# Patient Record
Sex: Male | Born: 1955 | Race: Black or African American | Hispanic: No | Marital: Single
Health system: Southern US, Community
[De-identification: ages and names within clinical notes are randomized; demographics above are authoritative.]

---

## 2013-12-23 ENCOUNTER — Observation Stay: Payer: Self-pay | Admitting: Internal Medicine

## 2013-12-23 DIAGNOSIS — R0789 Other chest pain: Secondary | ICD-10-CM

## 2013-12-23 DIAGNOSIS — R079 Chest pain, unspecified: Secondary | ICD-10-CM

## 2013-12-23 DIAGNOSIS — I517 Cardiomegaly: Secondary | ICD-10-CM

## 2013-12-23 LAB — BASIC METABOLIC PANEL
ANION GAP: 4 — AB (ref 7–16)
BUN: 17 mg/dL (ref 7–18)
CALCIUM: 8.2 mg/dL — AB (ref 8.5–10.1)
CHLORIDE: 107 mmol/L (ref 98–107)
Co2: 27 mmol/L (ref 21–32)
Creatinine: 1.3 mg/dL (ref 0.60–1.30)
GFR CALC NON AF AMER: 60 — AB
GLUCOSE: 123 mg/dL — AB (ref 65–99)
OSMOLALITY: 279 (ref 275–301)
Potassium: 4.2 mmol/L (ref 3.5–5.1)
Sodium: 138 mmol/L (ref 136–145)

## 2013-12-23 LAB — CBC
HCT: 41.6 % (ref 40.0–52.0)
HGB: 13.2 g/dL (ref 13.0–18.0)
MCH: 28.1 pg (ref 26.0–34.0)
MCHC: 31.8 g/dL — AB (ref 32.0–36.0)
MCV: 89 fL (ref 80–100)
Platelet: 266 10*3/uL (ref 150–440)
RBC: 4.7 10*6/uL (ref 4.40–5.90)
RDW: 14.8 % — ABNORMAL HIGH (ref 11.5–14.5)
WBC: 7.3 10*3/uL (ref 3.8–10.6)

## 2013-12-23 LAB — TROPONIN I
TROPONIN-I: 0.05 ng/mL
TROPONIN-I: 0.06 ng/mL — AB
TROPONIN-I: 0.05 ng/mL

## 2013-12-23 LAB — CK TOTAL AND CKMB (NOT AT ARMC)
CK, TOTAL: 547 U/L — AB
CK-MB: 1.8 ng/mL (ref 0.5–3.6)

## 2013-12-23 LAB — PROTIME-INR
INR: 1.1
Prothrombin Time: 14.1 secs (ref 11.5–14.7)

## 2013-12-23 LAB — APTT: Activated PTT: 28.2 secs (ref 23.6–35.9)

## 2014-10-01 NOTE — Consult Note (Signed)
General Aspect 59 y/o M with h/o rheumatic fever, impaired gluocse tolerance, hyperlipidemia, obesity, and BPH presents to [facility] ED this morning after waking up, rolling over to his side and developing substernal chest pain. Upon arrival to the ED initial troponin was 0.05, had GXT with 0.5 mm upslopping ST depression in inferior leads and was without chest pain during the study. Second troponin 0.06. He has remained chest pain free since his initial episode which lasted approx 10-15 minutes. _________________________   Present Illness 60 y/o M with the above history who is traveling through [place], [place] on his way to [place], as he is relocating there from [place] to be with his family. This morning he developed substernal chest pain with associated nausea and diaphoresis after waking up in his hotel room and rolling over to his side. The chest pain lasted for about 5 minutes and the associated symptoms lasted about 10-15 minutes before self resolving. Upon arrival to the ED he was symptom free and states he almost turned around. His initial troponin was 0.05 and he had a GXT which showed 0.5 mm upslopping ST depression in inferior leads. He was without chest pain during the study. He did have many pacs and pvcs during the study. His second troponin came back elevated at 0.06.   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 He does not exercise and eats what he wants. PCP has said TC is elevated. Weight has been increasing as of late. He is a prior smoker of < 1 pack per week of 23 years, quit 20 years ago. He was a social drink with friends, but quit drinking 20 years ago. Prior THC use 30 years ago, no other drugs.   Physical Exam:  GEN well developed, well nourished, no acute distress, pleasant   HEENT hearing intact to voice   NECK supple   RESP normal resp effort  clear BS   CARD Regular rate and rhythm  No murmur    ABD denies tenderness  soft  normal BS   EXTR negative edema   SKIN normal to palpation   NEURO cranial nerves intact   PSYCH alert, A+O to time, place, person, good insight   Review of Systems:  General: No Complaints   Skin: No Complaints   ENT: No Complaints   Eyes: No Complaints   Neck: No Complaints   Respiratory: No Complaints   Cardiovascular: Chest pain or discomfort  resolved   Gastrointestinal: No Complaints   Genitourinary: No Complaints   Vascular: No Complaints   Musculoskeletal: No Complaints   Neurologic: No Complaints   Hematologic: No Complaints   Endocrine: No Complaints   Psychiatric: No Complaints   Review of Systems: All other systems were reviewed and found to be negative   Medications/Allergies Reviewed Medications/Allergies reviewed   Family & Social History:  Family and Social History:  Family History Coronary Artery Disease  Hypertension   Social History positive ETOH, positive Illicit drugs, prior THC 30 years ago   + Tobacco Prior (greater than 1 year)  prior smoker of <1 pack per week for 23 years, quit 20 years ago   Place of Living Home  relocating to [place] from [place] area     Pre-diabetes:    Hypercholesterolemia:    Rheumatic fever (as a child):    Vasectomy:   Home Medications: Medication Instructions Status  Flomax 0.4 mg  oral capsule 1 cap(s) orally once a day Active  krill 1000 milligram(s) orally once a day Active  multivitamin 1 tab(s) orally once a day Active   Lab Results:  Routine Chem:  16-Jul-15 06:51   Result Comment Potassium/Total CK - Slight hemolysis, interpret results with  - caution.  Result(s) reported on 23 Dec 2013 at 07:30AM.  Glucose, Serum  123  BUN 17  Creatinine (comp) 1.30  Sodium, Serum 138  Potassium, Serum 4.2  Chloride, Serum 107  CO2, Serum 27  Calcium (Total), Serum  8.2  Anion Gap  4  Osmolality (calc) 279  eGFR (African American) >60  eGFR (Non-African  American)  60 (eGFR values <18m/min/1.73 m2 may be an indication of chronic kidney disease (CKD). Calculated eGFR is useful in patients with stable renal function. The eGFR calculation will not be reliable in acutely ill patients when serum creatinine is changing rapidly. It is not useful in  patients on dialysis. The eGFR calculation may not be applicable to patients at the low and high extremes of body sizes, pregnant women, and vegetarians.)  Cardiac:  16-Jul-15 06:51   Troponin I 0.05 (0.00-0.05 0.05 ng/mL or less: NEGATIVE  Repeat testing in 3-6 hrs  if clinically indicated. >0.05 ng/mL: POTENTIAL  MYOCARDIAL INJURY. Repeat  testing in 3-6 hrs if  clinically indicated. NOTE: An increase or decrease  of 30% or more on serial  testing suggests a  clinically important change)  CK, Total  547 (39-308 NOTE: NEW REFERENCE RANGE  07/12/2013)  CPK-MB, Serum 1.8 (Result(s) reported on 23 Dec 2013 at 07:45AM.)  Routine Coag:  16-Jul-15 06:51   Prothrombin 14.1  INR 1.1 (INR reference interval applies to patients on anticoagulant therapy. A single INR therapeutic range for coumarins is not optimal for all indications; however, the suggested range for most indications is 2.0 - 3.0. Exceptions to the INR Reference Range may include: Prosthetic heart valves, acute myocardial infarction, prevention of myocardial infarction, and combinations of aspirin and anticoagulant. The need for a higher or lower target INR must be assessed individually. Reference: The Pharmacology and Management of the Vitamin K  antagonists: the seventh ACCP Conference on Antithrombotic and Thrombolytic Therapy. CMVHQI.6962Sept:126 (3suppl): [id number] A HCT value >55% may artifactually increase the PT.  In one study,  the increase was an average of 25%. Reference:  "Effect on Routine and Special Coagulation Testing Values of Citrate Anticoagulant Adjustment in Patients with High HCT Values." American Journal  of Clinical Pathology 2006;126:400-405.)  Activated PTT (APTT) 28.2 (A HCT value >55% may artifactually increase the APTT. In one study, the increase was an average of 19%. Reference: "Effect on Routine and Special Coagulation Testing Values of Citrate Anticoagulant Adjustment in Patients with High HCT Values." American Journal of Clinical Pathology 2006;126:400-405.)  Routine Hem:  16-Jul-15 06:51   WBC (CBC) 7.3  RBC (CBC) 4.70  Hemoglobin (CBC) 13.2  Hematocrit (CBC) 41.6  Platelet Count (CBC) 266 (Result(s) reported on 23 Dec 2013 at 07:09AM.)  MCV 89  MCH 28.1  MCHC  31.8  RDW  14.8   EKG:  EKG Interp. by me   Interpretation NSR, 74, no st/t changes, suspect possible lead placement in lead III on 12 lead as this improved on GXT   Radiology Results: Cardiology:    16-Jul-15 08:58, Stress Test  Stress Test     No Known Allergies:    Impression 59year old male with history of rheumatic fever, pre-diabetes, hyperlipidemia, obesity, and BPH who presented  to [facility] ED this morning with substernal chest pain and associated nausea and diaphoresis who is now symptom free and found to have an elevated 2nd troponin of 0.06.   1. Angina: Patient with initial troponin of 0.05->0.06. Will await third troponin. Plan for echo today. Had GXT this morning with 0.5 mm upslopping ST depression in inferior leads which is not diagnostic for ischemia and he did not have chest pain during the study. Hold heparin until we have final troponin.   2. Frequent pac/pvcs: Will obtain echo for further evaluation.  3. H/O rheumatic fever: Echo  4. Obesity: PCP to manage  5. H/O of hyperlipidemia: If stay overnight plan for FLP.   6. Pre-diabetes: Per IM.   Electronic Signatures for Addendum Section:  Kathlyn Sacramento (MD) (Signed Addendum 17-Jul-15 08:09)  The patient was seen and examined. Agree with the above. He reports no recurrent chest pain. No murmurs by exam. Third TnI was negative.  Echo  showed normal EF and wall motion.  Treadmill stress test yesterday was negative for ischemia.  Can discharge home from a cardiac standpoint. Follow up with PCP in few weeks.   Electronic Signatures: Kathlyn Sacramento (MD)  (Signed 17-Jul-15 08:09)  Co-Signer: General Aspect/Present Illness, History and Physical Exam, Review of System, Family & Social History, Past Medical History, Home Medications, Labs, EKG , Radiology, Allergies, Impression/Plan Idolina Primer, Shaquayla Klimas M (PA-C)  (Signed 16-Jul-15 13:08)  Authored: General Aspect/Present Illness, History and Physical Exam, Review of System, Family & Social History, Past Medical History, Home Medications, Labs, EKG , Radiology, Allergies, Impression/Plan   Last Updated: 17-Jul-15 08:09 by Kathlyn Sacramento (MD)

## 2014-10-01 NOTE — H&P (Signed)
PATIENT NAME:  Malik Sullivan, Malik Sullivan MR#:  [id number] DATE OF BIRTH:  1955-06-29  DATE OF ADMISSION:  12/23/2013  PRIMARY CARE PHYSICIAN: At [place].   REFERRING EMERGENCY ROOM PHYSICIAN: Lurena JoinerRebecca L. Lord, MD.   CHIEF COMPLAINT: Chest pain.   HISTORY OF PRESENTING ILLNESS: A 59 year old male with a history of BPH, prediabetic, and slightly elevated cholesterol, but they both are diet controlled. Lives in [place] and was heading towards [place] by driving. Burgess EstelleYesterday he drove from [place] after 3:00 p.m., drove for 4-1/2 hours continuous without taking a break, and pulled over at nearby exit here to stop for night. He slept in a hotel room, had his dinner around 8:30 or 9:00, was up, up to 1:00, and slept after that. In the morning, he woke up with some chest tightness. The pain was more like a burning in the center of the chest. He initially thought maybe it is because he slept on his stomach then turned on the sideward, sitting up, but the pain did not resolve. It took a few minutes to almost an hour for the pain to go away, and so he decided to come to Emergency Room before he started his journey further. In ER, first troponin was negative. ER physician spoke to Dr. Kirke CorinArida on phone. He suggested to get exercise stress test, which is done, and while having that he had some ST-T changes with ST depressions with some PACs and PVCs. After the stress test, second troponin came almost same; just minimally elevated. ER physician spoke to Dr. Kirke CorinArida, and he suggested to admit him for further workup, so given to hospitalist team. During my questioning, he denies feeling any cough or excessive short of breath, but he felt somewhat short of breath while doing the stress test, which is usual for him at home, also. He denies any chest pain in the past, but he does not do a lot of workout or exercise or physical activities. He sometimes had burning chest pains when he sleeps on his stomach at night. He denies taking any  anti-inflammatory or pain medications on a regular basis.  REVIEW OF SYSTEMS:  CONSTITUTIONAL: Negative for fever, fatigue, weakness, pain, or weight loss.  EYES: No blurring, double vision, discharge, or redness.  EARS, NOSE, THROAT: No tinnitus, ear pain, or hearing loss.  RESPIRATORY: No cough, wheezing, hemoptysis. Mild shortness of breath while doing the stress test exercise.  CARDIOVASCULAR: The patient had some chest pain, burning type, in central chest. No palpitation or arrhythmia. GASTROINTESTINAL: No nausea, vomiting, diarrhea, abdominal pain.  GENITOURINARY: No dysuria, hematuria, or increased frequency.  ENDOCRINE: No heat or cold intolerance, no excessive sweating.  SKIN: No acne, rashes, or lesions.  MUSCULOSKELETAL: No pain or swelling in the joints.  NEUROLOGICAL: No numbness, weakness, tremor, or vertigo.  PSYCHIATRIC: No anxiety, insomnia, bipolar disorder.   PAST MEDICAL HISTORY:  1.  Benign prostatic hypertrophy.  2.  Prediabetic, diet controlled.  3.  Cholesterol level was borderline high in the past, but after diet control, recently during his physical examination it came out to be normal. He does not take any medication for that.   PAST SURGICAL HISTORY: None.   SOCIAL HISTORY: Lives with family. He works in a [employer], mostly computer work, and he mostly has sedentary lifestyle. He was a smoker, not heavy; 1 pack was lasting him for a week, but he quit smoking almost 15 years ago. He drinks occasionally; not a regular drinker, and denies any illegal drug use.   FAMILY HISTORY:  Father had coronary artery disease, likely, and he died at age of 43. Mother had some kidney issues and she died.  HOME MEDICATION: Flomax and multivitamins.   PHYSICAL EXAMINATION:  VITAL SIGNS: In the ER, temperature 97.6, pulse 73, respirations 18, blood pressure 140/76, pulse oximetry 98% on room air.  GENERAL: The patient is obese, no acute distress, alert and oriented to time,  place, and person.  HEENT: Head and neck atraumatic. Conjunctivae pink. Oral mucosa moist.  NECK: Supple. No JVD.  RESPIRATORY: Bilateral equal and clear air entry.  CARDIOVASCULAR: S1, S2 present, regular. No murmur.  ABDOMEN: Soft, nontender. Bowel sounds present. No organomegaly.  SKIN: No rashes. LEGS: No edema.  NEUROLOGICAL: Power 5/5. No tremor or rigidity. Follows commands. Sensation is intact.  PSYCHIATRIC: Does not appear any acute psychiatric illness. JOINTS: No swelling or tenderness.  IMPORTANT LABORATORY RESULTS: Glucose 123, BUN 17, creatinine 1.30, sodium 138, potassium 4.2, chloride 107, CO2 of 27. Calcium is 8.2 and troponin first was 0.05, second 0.06. WBC 7.3, hemoglobin 13.2, platelet count 266,000. MCV is 89. INR is 1.1. Chest x-ray, portable, is done which shows borderline cardiomegaly, mild aortic tortuosity. There is no edema, no active disease.   ASSESSMENT AND PLAN: A 59 year old male who was driving from [place], drove 4-1/2 hours continuous yesterday, and then after resting for night, in the morning woke up with chest pain. Exercise stress test showed some premature ventricular contractions and premature atrial contractions with some ST-T changes. Troponins negative.  1.  Chest pain. As mentioned above, Dr. Kirke Corin already evaluated his exercise stress test and advised to admit him for further workup. Will do echocardiogram and follow his serial troponins for now. Admit to telemetry and continue aspirin. Do echocardiogram, and further workup to be decided by Dr. Kirke Corin.  2.  Benign prostatic hypertrophy. Continue Flomax.  3.  Prediabetic and diet controlled. Continue diabetic diet.  CODE STATUS: Full code.  TOTAL TIME SPENT ON THIS ADMISSION: 45 minutes.    ____________________________ Hope Pigeon Elisabeth Pigeon, MD vgv:[id number] D: 12/23/2013 11:55:54 ET T: 12/23/2013 12:25:59 ET JOB#: [id number]  cc: Hope Pigeon. Elisabeth Pigeon, MD, <Dictator> Altamese Dilling  MD ELECTRONICALLY SIGNED 12/24/2013 12:40

## 2014-10-01 NOTE — Discharge Summary (Signed)
PATIENT NAME:  Malik Sullivan, Malik Sullivan MR#:  [id number] DATE OF BIRTH:  [birth year]  DATE OF ADMISSION:  12/23/2013 DATE OF DISCHARGE:  12/24/2013  ADMITTING DIAGNOSIS: Chest pain.   DISCHARGE DIAGNOSES:  1. Felt to be chest pain, noncardiac in origin status post cardiology evaluation, status post stress test the day prior with nonspecific ST-wave changes. Cardiology felt that this was a nonspecific finding. He was observed overnight with no further symptoms. He was recommended to follow up with his primary care provider. If symptoms persist, he will need a cardiac catheterization.  2. Benign prostatic hypertrophy.  3. Diet-controlled diabetes.  4. Borderline hyperlipidemia.   CONSULTANTS: Dr. Kirke CorinArida.   LABORATORY DATA: Glucose 123, BUN 17, creatinine 1.30, sodium 138, potassium 4.2, chloride 107, CO2 of 27, calcium 8.2. Troponin was 0.05 first set, second set 0.06. WBC 7.3, hemoglobin 13.2, platelet count was 266,000.   Chest x-ray shows borderline cardiomegaly, mild aortic tortuosity. No edema or active disease.  Echocardiogram of the heart showed normal EF, impaired relaxation pattern of the left ventricular diastolic filling, mild concentric LVH, normal wall motion abnormality.   HOSPITAL COURSE: Please refer to the history and physical done by the admitting physician. The patient is a 59 year old who lives in [place] and was heading towards [place] by driving. On his way here, he was having some chest pain. The patient was seen in the ED with a complaint of chest pain. They spoke to Dr. Kirke CorinArida. He underwent a stress test with some nonspecific ST-T wave changes; therefore, Dr. Kirke CorinArida recommended that the patient be observed because his troponin was 0.06. The patient was observed overnight with no further symptoms. He was seen in consultation back by Dr. Kirke CorinArida, who recommended he follow up outpatient with his primary care provider in [place]. At that time, if he has persistent symptoms, cardiac  catheterization would be warranted.   At this time, he is stable for discharge.   DISCHARGE MEDICATIONS: (Dictation Anomaly)  daily, Krill 1000 mg daily,  multivitamin daily, aspirin 81 mg 1 tablet p.o. daily.   DIET: Low sodium, low fat, low cholesterol, carbohydratediet.   FOLLOWUP: Follow with primary M.D. in 1 to 2 weeks.   ACTIVITY: As tolerated.   TIME SPENT: 35 minutes.    ____________________________ Lacie ScottsShreyang H. Allena KatzPatel, MD shp:jr D: 12/25/2013 08:25:06 ET T: 12/25/2013 14:00:22 ET JOB#: [id number]  cc: Bradden Tadros H. Allena KatzPatel, MD, <Dictator> Charise CarwinSHREYANG H Liller Yohn MD ELECTRONICALLY SIGNED 12/29/2013 12:26

## 2015-03-31 IMAGING — CR DG CHEST 1V PORT
1 series · 1 of 1 positions shown · non-contrast
Comparison: None.

CLINICAL DATA: Chest pain.

EXAM:
PORTABLE CHEST - 1 VIEW

[ap]
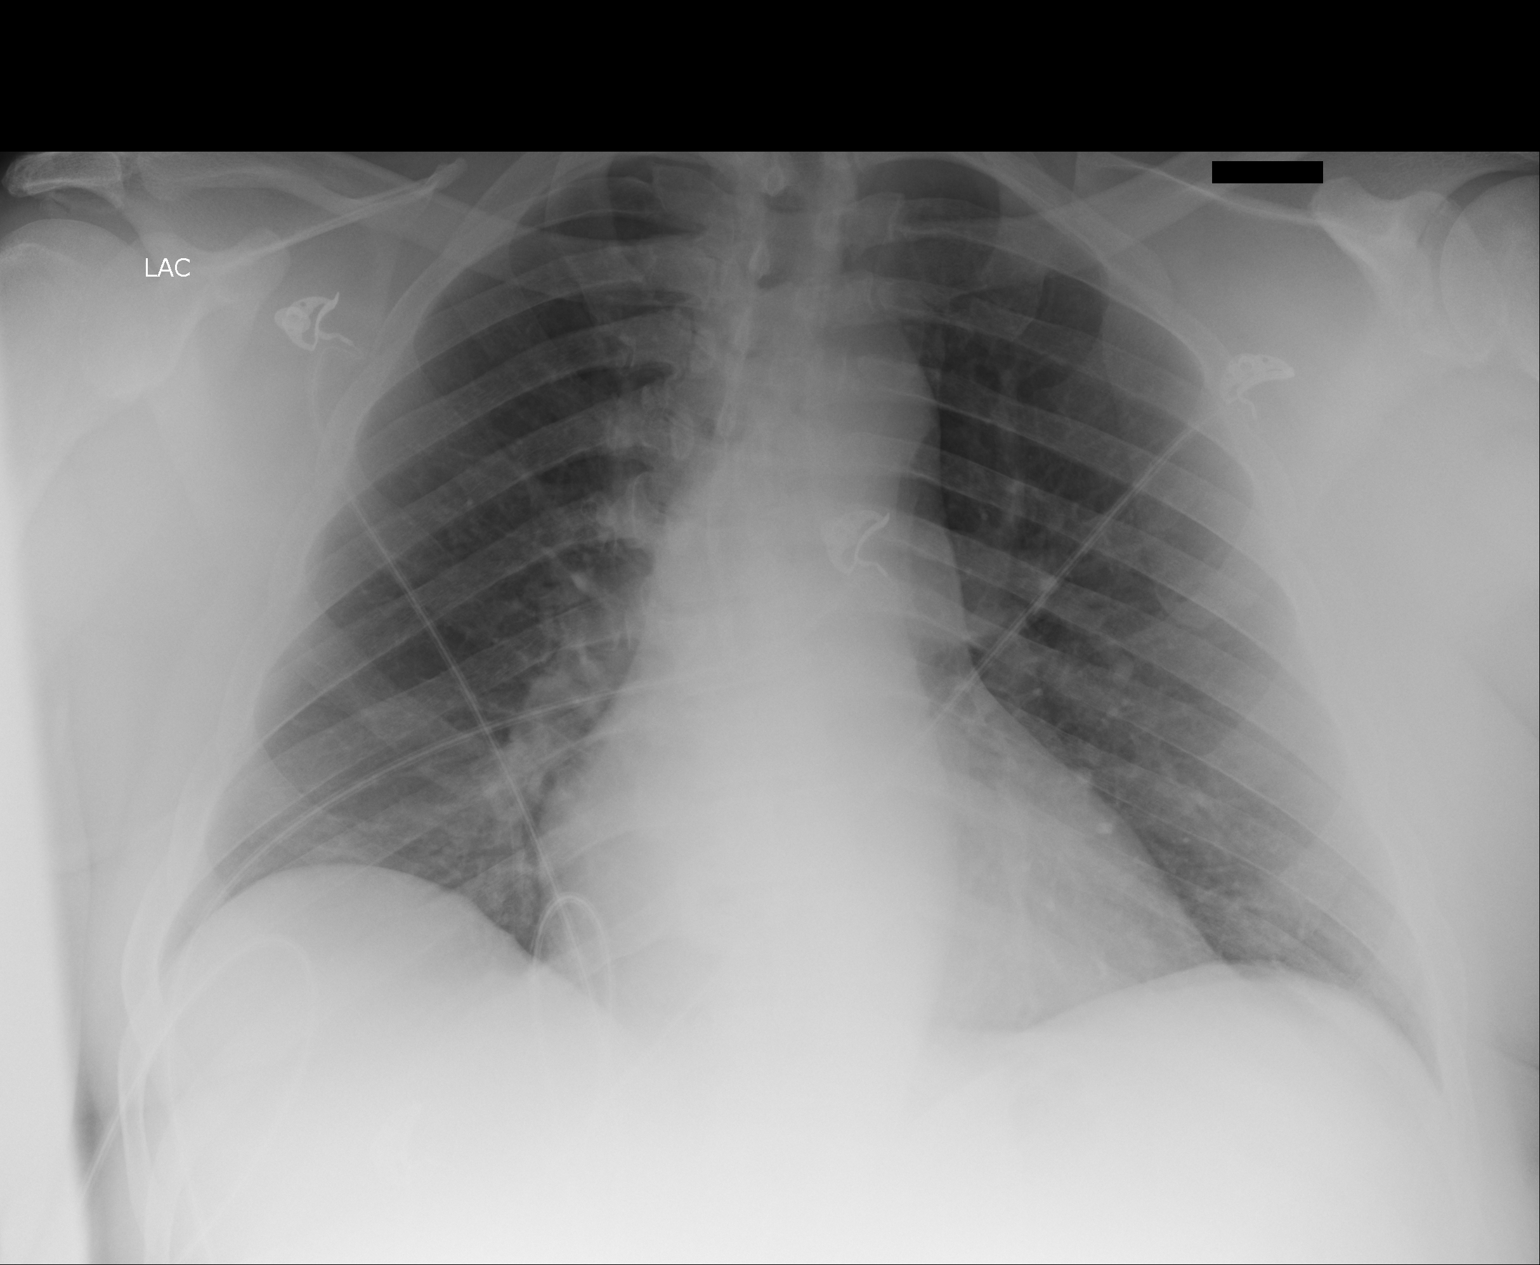

[1 of 1 positions shown; findings below may reference images not displayed]

FINDINGS: Borderline cardiomegaly. Mild aortic tortuosity. There is no edema,
consolidation, effusion, or pneumothorax.
IMPRESSION: No active disease.
